# Patient Record
Sex: Female | Born: 1995 | Race: Black or African American | Hispanic: No | Marital: Single | Smoking: Never smoker
Health system: Southern US, Community
[De-identification: ages and names within clinical notes are randomized; demographics above are authoritative.]

## PROBLEM LIST (undated history)

## (undated) DIAGNOSIS — J45909 Unspecified asthma, uncomplicated: Secondary | ICD-10-CM

---
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
## 2013-11-03 ENCOUNTER — Emergency Department (HOSPITAL_COMMUNITY)
Admission: EM | Admit: 2013-11-03 | Discharge: 2013-11-04 | Disposition: A | Discharge status: Home / Self Care | Payer: BC Managed Care – PPO | Attending: Emergency Medicine | Admitting: Emergency Medicine

## 2013-11-03 ENCOUNTER — Encounter (HOSPITAL_COMMUNITY): Payer: Self-pay | Admitting: Emergency Medicine

## 2013-11-03 DIAGNOSIS — H5789 Other specified disorders of eye and adnexa: Secondary | ICD-10-CM | POA: Insufficient documentation

## 2013-11-03 DIAGNOSIS — J9801 Acute bronchospasm: Secondary | ICD-10-CM

## 2013-11-03 DIAGNOSIS — J45901 Unspecified asthma with (acute) exacerbation: Secondary | ICD-10-CM | POA: Insufficient documentation

## 2013-11-03 HISTORY — DX: Unspecified asthma, uncomplicated: J45.909

## 2013-11-03 MED ORDER — ALBUTEROL SULFATE (2.5 MG/3ML) 0.083% IN NEBU
5.0000 mg | INHALATION_SOLUTION | Freq: Once | RESPIRATORY_TRACT | Status: AC
  Administered 2013-11-03: 5 mg via RESPIRATORY_TRACT
  Filled 2013-11-03: qty 6

## 2013-11-03 NOTE — ED Notes (Signed)
Chest tight and wheezing. Doesn't use an inhaler.

## 2013-11-04 MED ORDER — ALBUTEROL SULFATE HFA 108 (90 BASE) MCG/ACT IN AERS
2.0000 | INHALATION_SPRAY | RESPIRATORY_TRACT | Status: DC
Start: 2013-11-04 — End: 2013-11-04
  Administered 2013-11-04: 2 via RESPIRATORY_TRACT
  Filled 2013-11-04: qty 6.7

## 2013-11-04 NOTE — ED Provider Notes (Signed)
CSN: [id number]     Arrival date & time 11/03/13  2326 History   First MD Initiated Contact with Patient 11/03/13 2359     Chief Complaint  Patient presents with  . Wheezing     (Consider location/radiation/quality/duration/timing/severity/associated sxs/prior Treatment) HPI Laura Cobb is a 18 y.o. female who presents emergency department complaining of shortness of breath and chest tightness. Patient states her symptoms began this afternoon. States she has history of asthma. She denies any particular triggers, states she thinks it could be seasonal allergies. States she began having coughing attacks, shortness of breath, and states felt like her chest was tight she wasn't getting enough air. Patient states she did not have an inhaler so she came to emergency department. Patient received a breathing treatment prior to me seeing her and states she feels much better. She denies any fever, chills. No sore throat. States mild nasal congestion watery itchy eyes. Patient does admit to smoking.  Past Medical History  Diagnosis Date  . Asthma    History reviewed. No pertinent past surgical history. No family history on file. History  Substance Use Topics  . Smoking status: Never Smoker   . Smokeless tobacco: Not on file  . Alcohol Use: No   OB History   Grav Para Term Preterm Abortions TAB SAB Ect Mult Living                 Review of Systems  Constitutional: Negative for fever and chills.  Respiratory: Positive for cough, chest tightness, shortness of breath and wheezing.   Cardiovascular: Negative for chest pain, palpitations and leg swelling.  Gastrointestinal: Negative for nausea, vomiting, abdominal pain and diarrhea.  Genitourinary: Negative for dysuria, flank pain and pelvic pain.  Musculoskeletal: Negative for arthralgias, myalgias, neck pain and neck stiffness.  Skin: Negative for rash.  Neurological: Negative for dizziness, weakness and headaches.  All other systems  reviewed and are negative.     Allergies  Peanut-containing drug products and Strawberry  Home Medications   Prior to Admission medications   Not on File   BP 120/72  Pulse 101  Temp(Src) 98.6 F (37 C) (Oral)  Resp 18  SpO2 98%  LMP 10/16/2013 Physical Exam  Nursing note and vitals reviewed. Constitutional: She appears well-developed and well-nourished. No distress.  HENT:  Head: Normocephalic.  Eyes: Conjunctivae are normal.  Neck: Neck supple.  Cardiovascular: Normal rate, regular rhythm and normal heart sounds.   Pulmonary/Chest: Effort normal and breath sounds normal. No respiratory distress. She has no wheezes. She has no rales.  Abdominal: Soft. Bowel sounds are normal. She exhibits no distension. There is no tenderness. There is no rebound.  Musculoskeletal: She exhibits no edema.  Neurological: She is alert.  Skin: Skin is warm and dry.  Psychiatric: She has a normal mood and affect. Her behavior is normal.    ED Course  Procedures (including critical care time) Labs Review Labs Reviewed - No data to display  Imaging Review No results found.   EKG Interpretation None      MDM   Final diagnoses:  Bronchospasm    Patient with seasonal allergies, here with acute asthma attack. She already received a breathing treatment prior to me seeing her, on my examination there is no wheezing, her exam is completely unremarkable. States she feels much better. She denies any longer having shortness of breath or chest tightness. Patient wants to go home. I provided her with an inhaler. Also advised her to start on Zyrtec  or Claritin for seasonal allergies. Upon discharge, patient is asymptomatic. She will followup with primary care Dr.   Ceasar MonsFiled Vitals:   11/03/13 2331  BP: 120/72  Pulse: 101  Temp: 98.6 F (37 C)  TempSrc: Oral  Resp: 18  SpO2: 98%       Myriam Jacobsonatyana A Sheniece Ruggles, PA-C 11/04/13 0037

## 2013-11-04 NOTE — ED Provider Notes (Signed)
Medical screening examination/treatment/procedure(s) were performed by non-physician practitioner and as supervising physician I was immediately available for consultation/collaboration.   EKG Interpretation None       Olivia Mackielga M Robbi Spells, MD 11/04/13 [phone]

## 2013-11-04 NOTE — Discharge Instructions (Signed)
Take any other, 2 puffs every 4 hours. Start taking Zyrtec or Claritin daily for seasonal allergies to prevent from triggering her asthma. Followup with your Dr. as needed. Return if your symptoms are worsening. Stop smoking   Asthma, Acute Bronchospasm Acute bronchospasm caused by asthma is also referred to as an asthma attack. Bronchospasm means your air passages become narrowed. The narrowing is caused by inflammation and tightening of the muscles in the air tubes (bronchi) in your lungs. This can make it hard to breath or cause you to wheeze and cough. CAUSES Possible triggers are:  Animal dander from the skin, hair, or feathers of animals.  Dust mites contained in house dust.  Cockroaches.  Pollen from trees or grass.  Mold.  Cigarette or tobacco smoke.  Air pollutants such as dust, household cleaners, hair sprays, aerosol sprays, paint fumes, strong chemicals, or strong odors.  Cold air or weather changes. Cold air may trigger inflammation. Winds increase molds and pollens in the air.  Strong emotions such as crying or laughing hard.  Stress.  Certain medicines such as aspirin or beta-blockers.  Sulfites in foods and drinks, such as dried fruits and wine.  Infections or inflammatory conditions, such as a flu, cold, or inflammation of the nasal membranes (rhinitis).  Gastroesophageal reflux disease (GERD). GERD is a condition where stomach acid backs up into your throat (esophagus).  Exercise or strenuous activity. SIGNS AND SYMPTOMS   Wheezing.  Excessive coughing, particularly at night.  Chest tightness.  Shortness of breath. DIAGNOSIS  Your health care provider will ask you about your medical history and perform a physical exam. A chest X-ray or blood testing may be performed to look for other causes of your symptoms or other conditions that may have triggered your asthma attack. TREATMENT  Treatment is aimed at reducing inflammation and opening up the  airways in your lungs. Most asthma attacks are treated with inhaled medicines. These include quick relief or rescue medicines (such as bronchodilators) and controller medicines (such as inhaled corticosteroids). These medicines are sometimes given through an inhaler or a nebulizer. Systemic steroid medicine taken by mouth or given through an IV tube also can be used to reduce the inflammation when an attack is moderate or severe. Antibiotic medicines are only used if a bacterial infection is present.  HOME CARE INSTRUCTIONS   Rest.  Drink plenty of liquids. This helps the mucus to remain thin and be easily coughed up. Only use caffeine in moderation and do not use alcohol until you have recovered from your illness.  Do not smoke. Avoid being exposed to secondhand smoke.  You play a critical role in keeping yourself in good health. Avoid exposure to things that cause you to wheeze or to have breathing problems.  Keep your medicines up to date and available. Carefully follow your health care provider's treatment plan.  Take your medicine exactly as prescribed.  When pollen or pollution is bad, keep windows closed and use an air conditioner or go to places with air conditioning.  Asthma requires careful medical care. See your health care provider for a follow-up as advised. If you are more than [redacted] weeks pregnant and you were prescribed any new medicines, let your obstetrician know about the visit and how you are doing. Follow-up with your health care provider as directed.  After you have recovered from your asthma attack, make an appointment with your outpatient doctor to talk about ways to reduce the likelihood of future attacks. If you do  not have a doctor who manages your asthma, make an appointment with a primary care doctor to discuss your asthma. SEEK IMMEDIATE MEDICAL CARE IF:   You are getting worse.  You have trouble breathing. If severe, call your local emergency services (911 in the  [place]  You develop chest pain or discomfort.  You are vomiting.  You are not able to keep fluids down.  You are coughing up yellow, green, brown, or bloody sputum.  You have a fever and your symptoms suddenly get worse.  You have trouble swallowing. MAKE SURE YOU:   Understand these instructions.  Will watch your condition.  Will get help right away if you are not doing well or get worse. Document Released: 10/22/2006 Document Revised: 03/09/2013 Document Reviewed: 01/12/2013 [facility] Patient Information 2014 [place], [place].  Asthma Attack Prevention Although there is no way to prevent asthma from starting, you can take steps to control the disease and reduce its symptoms. Learn about your asthma and how to control it. Take an active role to control your asthma by working with your health care provider to create and follow an asthma action plan. An asthma action plan guides you in:  Taking your medicines properly.  Avoiding things that set off your asthma or make your asthma worse (asthma triggers).  Tracking your level of asthma control.  Responding to worsening asthma.  Seeking emergency care when needed. To track your asthma, keep records of your symptoms, check your peak flow number using a handheld device that shows how well air moves out of your lungs (peak flow meter), and get regular asthma checkups.  WHAT ARE SOME WAYS TO PREVENT AN ASTHMA ATTACK?  Take medicines as directed by your health care provider.  Keep track of your asthma symptoms and level of control.  With your health care provider, write a detailed plan for taking medicines and managing an asthma attack. Then be sure to follow your action plan. Asthma is an ongoing condition that needs regular monitoring and treatment.  Identify and avoid asthma triggers. Many outdoor allergens and irritants (such as pollen, mold, cold air, and air pollution) can trigger asthma attacks. Find out what your asthma  triggers are and take steps to avoid them.  Monitor your breathing. Learn to recognize warning signs of an attack, such as coughing, wheezing, or shortness of breath. Your lung function may decrease before you notice any signs or symptoms, so regularly measure and record your peak airflow with a home peak flow meter.  Identify and treat attacks early. If you act quickly, you are less likely to have a severe attack. You will also need less medicine to control your symptoms. When your peak flow measurements decrease and alert you to an upcoming attack, take your medicine as instructed and immediately stop any activity that may have triggered the attack. If your symptoms do not improve, get medical help.  Pay attention to increasing quick-relief inhaler use. If you find yourself relying on your quick-relief inhaler, your asthma is not under control. See your health care provider about adjusting your treatment. WHAT CAN MAKE MY SYMPTOMS WORSE? A number of common things can set off or make your asthma symptoms worse and cause temporary increased inflammation of your airways. Keep track of your asthma symptoms for several weeks, detailing all the environmental and emotional factors that are linked with your asthma. When you have an asthma attack, go back to your asthma diary to see which factor, or combination of factors, might have  contributed to it. Once you know what these factors are, you can take steps to control many of them. If you have allergies and asthma, it is important to take asthma prevention steps at home. Minimizing contact with the substance to which you are allergic will help prevent an asthma attack. Some triggers and ways to avoid these triggers are: Animal Dander:  Some people are allergic to the flakes of skin or dried saliva from animals with fur or feathers.   There is no such thing as a hypoallergenic dog or cat breed. All dogs or cats can cause allergies, even if they don't  shed.  Keep these pets out of your home.  If you are not able to keep a pet outdoors, keep the pet out of your bedroom and other sleeping areas at all times, and keep the door closed.  Remove carpets and furniture covered with cloth from your home. If that is not possible, keep the pet away from fabric-covered furniture and carpets. Dust Mites: Many people with asthma are allergic to dust mites. Dust mites are tiny bugs that are found in every home in mattresses, pillows, carpets, fabric-covered furniture, bedcovers, clothes, stuffed toys, and other fabric-covered items.   Cover your mattress in a special dust-proof cover.  Cover your pillow in a special dust-proof cover, or wash the pillow each week in hot water. Water must be hotter than 130 F (54.4 C) to kill dust mites. Cold or warm water used with detergent and bleach can also be effective.  Wash the sheets and blankets on your bed each week in hot water.  Try not to sleep or lie on cloth-covered cushions.  Call ahead when traveling and ask for a smoke-free hotel room. Bring your own bedding and pillows in case the hotel only supplies feather pillows and down comforters, which may contain dust mites and cause asthma symptoms.  Remove carpets from your bedroom and those laid on concrete, if you can.  Keep stuffed toys out of the bed, or wash the toys weekly in hot water or cooler water with detergent and bleach. Cockroaches: Many people with asthma are allergic to the droppings and remains of cockroaches.   Keep food and garbage in closed containers. Never leave food out.  Use poison baits, traps, powders, gels, or paste (for example, boric acid).  If a spray is used to kill cockroaches, stay out of the room until the odor goes away. Indoor Mold:  Fix leaky faucets, pipes, or other sources of water that have mold around them.  Clean floors and moldy surfaces with a fungicide or diluted bleach.  Avoid using humidifiers,  vaporizers, or swamp coolers. These can spread molds through the air. Pollen and Outdoor Mold:  When pollen or mold spore counts are high, try to keep your windows closed.  Stay indoors with windows closed from late morning to afternoon. Pollen and some mold spore counts are highest at that time.  Ask your health care provider whether you need to take anti-inflammatory medicine or increase your dose of the medicine before your allergy season starts. Other Irritants to Avoid:  Tobacco smoke is an irritant. If you smoke, ask your health care provider how you can quit. Ask family members to quit smoking too. Do not allow smoking in your home or car.  If possible, do not use a wood-burning stove, kerosene heater, or fireplace. Minimize exposure to all sources of smoke, including to incense, candles, fires, and fireworks.  Try to stay away  from strong odors and sprays, such as perfume, talcum powder, hair spray, and paints.  Decrease humidity in your home and use an indoor air cleaning device. Reduce indoor humidity to below 60%. Dehumidifiers or central air conditioners can do this.  Decrease house dust exposure by changing furnace and air cooler filters frequently.  Try to have someone else vacuum for you once or twice a week. Stay out of rooms while they are being vacuumed and for a short while afterward.  If you vacuum, use a dust mask from a hardware store, a double-layered or microfilter vacuum cleaner bag, or a vacuum cleaner with a HEPA filter.  Sulfites in foods and beverages can be irritants. Do not drink beer or wine or eat dried fruit, processed potatoes, or shrimp if they cause asthma symptoms.  Cold air can trigger an asthma attack. Cover your nose and mouth with a scarf on cold or windy days.  Several health conditions can make asthma more difficult to manage, including a runny nose, sinus infections, reflux disease, psychological stress, and sleep apnea. Work with your health  care provider to manage these conditions.  Avoid close contact with people who have a respiratory infection such as a cold or the flu, since your asthma symptoms may get worse if you catch the infection. Wash your hands thoroughly after touching items that may have been handled by people with a respiratory infection.  Get a flu shot every year to protect against the flu virus, which often makes asthma worse for days or weeks. Also get a pneumonia shot if you have not previously had one. Unlike the flu shot, the pneumonia shot does not need to be given yearly. Medicines:  Talk to your health care provider about whether it is safe for you to take aspirin or non-steroidal anti-inflammatory medicines (NSAIDs). In a small number of people with asthma, aspirin and NSAIDs can cause asthma attacks. These medicines must be avoided by people who have known aspirin-sensitive asthma. It is important that people with aspirin-sensitive asthma read labels of all over-the-counter medicines used to treat pain, colds, coughs, and fever.  Beta blockers and ACE inhibitors are other medicines you should discuss with your health care provider. HOW CAN I FIND OUT WHAT I AM ALLERGIC TO? Ask your asthma health care provider about allergy skin testing or blood testing (the RAST test) to identify the allergens to which you are sensitive. If you are found to have allergies, the most important thing to do is to try to avoid exposure to any allergens that you are sensitive to as much as possible. Other treatments for allergies, such as medicines and allergy shots (immunotherapy) are available.  CAN I EXERCISE? Follow your health care provider's advice regarding asthma treatment before exercising. It is important to maintain a regular exercise program, but vigorous exercise, or exercise in cold, humid, or dry environments can cause asthma attacks, especially for those people who have exercise-induced asthma. Document Released:  06/25/2009 Document Revised: 03/09/2013 Document Reviewed: 01/12/2013 [facility] Patient Information 2014 [place], [place].

## 2014-08-23 ENCOUNTER — Emergency Department (HOSPITAL_COMMUNITY): Payer: No Typology Code available for payment source

## 2014-08-23 ENCOUNTER — Encounter (HOSPITAL_COMMUNITY): Payer: Self-pay | Admitting: Physical Medicine and Rehabilitation

## 2014-08-23 ENCOUNTER — Emergency Department (HOSPITAL_COMMUNITY)
Admission: EM | Admit: 2014-08-23 | Discharge: 2014-08-23 | Disposition: A | Discharge status: Home / Self Care | Payer: No Typology Code available for payment source | Attending: Emergency Medicine | Admitting: Emergency Medicine

## 2014-08-23 DIAGNOSIS — S0990XA Unspecified injury of head, initial encounter: Secondary | ICD-10-CM | POA: Diagnosis present

## 2014-08-23 DIAGNOSIS — Y998 Other external cause status: Secondary | ICD-10-CM | POA: Insufficient documentation

## 2014-08-23 DIAGNOSIS — S0083XA Contusion of other part of head, initial encounter: Secondary | ICD-10-CM | POA: Insufficient documentation

## 2014-08-23 DIAGNOSIS — Y92410 Unspecified street and highway as the place of occurrence of the external cause: Secondary | ICD-10-CM | POA: Diagnosis not present

## 2014-08-23 DIAGNOSIS — Y9389 Activity, other specified: Secondary | ICD-10-CM | POA: Insufficient documentation

## 2014-08-23 DIAGNOSIS — J45909 Unspecified asthma, uncomplicated: Secondary | ICD-10-CM | POA: Diagnosis not present

## 2014-08-23 MED ORDER — IBUPROFEN 400 MG PO TABS
600.0000 mg | ORAL_TABLET | Freq: Once | ORAL | Status: AC
  Administered 2014-08-23: 600 mg via ORAL
  Filled 2014-08-23 (×2): qty 1

## 2014-08-23 MED ORDER — CYCLOBENZAPRINE HCL 10 MG PO TABS: 10.0000 mg | ORAL_TABLET | Freq: Two times a day (BID) | ORAL | Status: DC | PRN

## 2014-08-23 MED ORDER — NAPROXEN 500 MG PO TABS: 500.0000 mg | ORAL_TABLET | Freq: Two times a day (BID) | ORAL | Status: DC
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
## 2014-08-23 NOTE — Discharge Instructions (Signed)
Take naproxen for pain and inflammation. Take Flexeril for muscle spasms. Ice the right side of your head. Follow up with primary care doctor.   Head Injury You have received a head injury. It does not appear serious at this time. Headaches and vomiting are common following head injury. It should be easy to awaken from sleeping. Sometimes it is necessary for you to stay in the emergency department for a while for observation. Sometimes admission to the hospital may be needed. After injuries such as yours, most problems occur within the first 24 hours, but side effects may occur up to 7-10 days after the injury. It is important for you to carefully monitor your condition and contact your health care provider or seek immediate medical care if there is a change in your condition. WHAT ARE THE TYPES OF HEAD INJURIES? Head injuries can be as minor as a bump. Some head injuries can be more severe. More severe head injuries include:  A jarring injury to the brain (concussion).  A bruise of the brain (contusion). This mean there is bleeding in the brain that can cause swelling.  A cracked skull (skull fracture).  Bleeding in the brain that collects, clots, and forms a bump (hematoma). WHAT CAUSES A HEAD INJURY? A serious head injury is most likely to happen to someone who is in a car wreck and is not wearing a seat belt. Other causes of major head injuries include bicycle or motorcycle accidents, sports injuries, and falls. HOW ARE HEAD INJURIES DIAGNOSED? A complete history of the event leading to the injury and your current symptoms will be helpful in diagnosing head injuries. Many times, pictures of the brain, such as CT or MRI are needed to see the extent of the injury. Often, an overnight hospital stay is necessary for observation.  WHEN SHOULD I SEEK IMMEDIATE MEDICAL CARE?  You should get help right away if:  You have confusion or drowsiness.  You feel sick to your stomach (nauseous) or have  continued, forceful vomiting.  You have dizziness or unsteadiness that is getting worse.  You have severe, continued headaches not relieved by medicine. Only take over-the-counter or prescription medicines for pain, fever, or discomfort as directed by your health care provider.  You do not have normal function of the arms or legs or are unable to walk.  You notice changes in the black spots in the center of the colored part of your eye (pupil).  You have a clear or bloody fluid coming from your nose or ears.  You have a loss of vision. During the next 24 hours after the injury, you must stay with someone who can watch you for the warning signs. This person should contact local emergency services (911 in the [place] if you have seizures, you become unconscious, or you are unable to wake up. HOW CAN I PREVENT A HEAD INJURY IN THE FUTURE? The most important factor for preventing major head injuries is avoiding motor vehicle accidents. To minimize the potential for damage to your head, it is crucial to wear seat belts while riding in motor vehicles. Wearing helmets while bike riding and playing collision sports (like football) is also helpful. Also, avoiding dangerous activities around the house will further help reduce your risk of head injury.  WHEN CAN I RETURN TO NORMAL ACTIVITIES AND ATHLETICS? You should be reevaluated by your health care provider before returning to these activities. If you have any of the following symptoms, you should not return to activities  or contact sports until 1 week after the symptoms have stopped:  Persistent headache.  Dizziness or vertigo.  Poor attention and concentration.  Confusion.  Memory problems.  Nausea or vomiting.  Fatigue or tire easily.  Irritability.  Intolerant of bright lights or loud noises.  Anxiety or depression.  Disturbed sleep. MAKE SURE YOU:   Understand these instructions.  Will watch your condition.  Will get help  right away if you are not doing well or get worse. Document Released: 07/07/2005 Document Revised: 07/12/2013 Document Reviewed: 03/14/2013 [facility] Patient Information 2015 [place], [place]. This information is not intended to replace advice given to you by your health care provider. Make sure you discuss any questions you have with your health care provider.

## 2014-08-23 NOTE — ED Notes (Signed)
Declined W/C at D/C and was escorted to lobby by RN. 
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
## 2014-09-27 ENCOUNTER — Encounter (HOSPITAL_COMMUNITY): Payer: Self-pay | Admitting: *Deleted

## 2014-09-27 ENCOUNTER — Inpatient Hospital Stay (HOSPITAL_COMMUNITY): Payer: BLUE CROSS/BLUE SHIELD

## 2014-09-27 ENCOUNTER — Inpatient Hospital Stay (HOSPITAL_COMMUNITY)
Admission: AD | Admit: 2014-09-27 | Discharge: 2014-09-27 | Disposition: A | Discharge status: Home / Self Care | Payer: BLUE CROSS/BLUE SHIELD | Source: Ambulatory Visit | Attending: Family Medicine | Admitting: Family Medicine

## 2014-09-27 DIAGNOSIS — O200 Threatened abortion: Secondary | ICD-10-CM | POA: Diagnosis not present

## 2014-09-27 DIAGNOSIS — Z3A01 Less than 8 weeks gestation of pregnancy: Secondary | ICD-10-CM | POA: Insufficient documentation

## 2014-09-27 DIAGNOSIS — O209 Hemorrhage in early pregnancy, unspecified: Secondary | ICD-10-CM

## 2014-09-27 LAB — CBC
HEMATOCRIT: 36.6 % (ref 36.0–46.0)
HEMOGLOBIN: 12.1 g/dL (ref 12.0–15.0)
MCH: 27.2 pg (ref 26.0–34.0)
MCHC: 33.1 g/dL (ref 30.0–36.0)
MCV: 82.2 fL (ref 78.0–100.0)
Platelets: 253 10*3/uL (ref 150–400)
RBC: 4.45 MIL/uL (ref 3.87–5.11)
RDW: 14.3 % (ref 11.5–15.5)
WBC: 8.7 10*3/uL (ref 4.0–10.5)

## 2014-09-27 LAB — URINALYSIS, ROUTINE W REFLEX MICROSCOPIC
Bilirubin Urine: NEGATIVE
Glucose, UA: NEGATIVE mg/dL
Ketones, ur: NEGATIVE mg/dL
Leukocytes, UA: NEGATIVE
NITRITE: NEGATIVE
PH: 6.5 (ref 5.0–8.0)
PROTEIN: NEGATIVE mg/dL
Specific Gravity, Urine: <1.005 — ABNORMAL LOW (ref 1.005–1.030)
Urobilinogen, UA: 0.2 mg/dL (ref 0.0–1.0)

## 2014-09-27 LAB — WET PREP, GENITAL
Trich, Wet Prep: NONE SEEN
WBC, Wet Prep HPF POC: NONE SEEN
Yeast Wet Prep HPF POC: NONE SEEN

## 2014-09-27 LAB — URINE MICROSCOPIC-ADD ON

## 2014-09-27 LAB — HCG, QUANTITATIVE, PREGNANCY: HCG, BETA CHAIN, QUANT, S: 2268 m[IU]/mL — AB (ref <5)

## 2014-09-27 LAB — POCT PREGNANCY, URINE: PREG TEST UR: POSITIVE — AB

## 2014-09-27 NOTE — MAU Note (Signed)
Couple days ago, started spotting, now it is heavier like a period. Some cramping in lower abd. Denies pain with urination, vomiting or diarrhea.

## 2014-09-27 NOTE — MAU Note (Signed)
Urine in lab 

## 2014-09-27 NOTE — Discharge Instructions (Signed)

## 2014-09-27 NOTE — MAU Provider Note (Signed)
History     CSN: [id number]  Arrival date and time: 09/27/14 1610   First Provider Initiated Contact with Patient 09/27/14 1700      Chief Complaint  Patient presents with  . Vaginal Bleeding   HPI Laura Cobb 18 y.o. G1P0 @[redacted]w[redacted]d  presents to MAU with complaint of vaginal bleeding and cramping.  She notes the bleeding started as spotting yesterday and today has become more like a light period day.  It is dark red blood and associated with a crampy pain.  She denies nausea, vomiting, weakness, fever, dysuria.   OB History    Gravida Para Term Preterm AB TAB SAB Ectopic Multiple Living   1               Past Medical History  Diagnosis Date  . Asthma     History reviewed. No pertinent past surgical history.  History reviewed. No pertinent family history.  History  Substance Use Topics  . Smoking status: Never Smoker   . Smokeless tobacco: Not on file  . Alcohol Use: No    Allergies:  Allergies  Allergen Reactions  . Peanut-Containing Drug Products Other (See Comments)    Childhood allergy   . Strawberry Other (See Comments)    Childhood allergy    Prescriptions prior to admission  Medication Sig Dispense Refill Last Dose  . cyclobenzaprine (FLEXERIL) 10 MG tablet Take 1 tablet (10 mg total) by mouth 2 (two) times daily as needed for muscle spasms. (Patient not taking: Reported on 09/27/2014) 20 tablet 0   . naproxen (NAPROSYN) 500 MG tablet Take 1 tablet (500 mg total) by mouth 2 (two) times daily. (Patient not taking: Reported on 09/27/2014) 30 tablet 0     ROS Pertinent ROS in HPI  Physical Exam   Blood pressure 119/59, pulse 84, temperature 98 F (36.7 C), temperature source Oral, resp. rate 16, height 5' (1.524 m), weight 119 lb (53.978 kg), last menstrual period 08/16/2014.  Physical Exam  Constitutional: She is oriented to person, place, and time. She appears well-developed and well-nourished. No distress.  HENT:  Head: Normocephalic and atraumatic.   Eyes: EOM are normal.  Neck: Normal range of motion.  Cardiovascular: Normal rate.   Respiratory: Effort normal and breath sounds normal. No respiratory distress.  GI: Soft. Bowel sounds are normal. She exhibits no distension. There is no tenderness. There is no rebound and no guarding.  Genitourinary:  Pooling of dark red blood in vagina.  NO CMT.  No adnexal mass or tenderness  Musculoskeletal: Normal range of motion.  Neurological: She is alert and oriented to person, place, and time.  Skin: Skin is warm and dry.  Psychiatric: She has a normal mood and affect.   [place] Ob Comp Less 14 Wks  09/27/2014   CLINICAL DATA:  Vaginal bleeding.  Symptoms for 2 days.  EXAM: OBSTETRIC <14 WK US AND TRANSVAGINAL OB US  TECHNIQUE: Both transabdominal and transvaginal ultrasound examinations were performed for complete evaluation of the gestation as well as the maternal uterus, adnexal regions, and pelvic cul-de-sac. Transvaginal technique was performed to assess early pregnancy.  COMPARISON:  None.  FINDINGS: Intrauterine gestational sac: Present  Yolk sac:  Questionable  Embryo:  None  MSD: 6.4  mm   5 w   2  d            US EDC: 05/28/2015  Maternal uterus/adnexae: The gestational sac has an oblong shape and located in the lower uterine segment. There is a  trace amount of free fluid in the pelvis. There is a questionable yolk sac without an embryo. Hypoechoic structure in the left ovary measuring up to 2.9 cm is compatible with a cyst. Normal appearance of the right ovary. Evidence for a small subchorionic hemorrhage.  IMPRESSION: Presumed gestational sac is located in the lower uterine segment. Based on the history of heavy vaginal bleeding and location of the gestational sac, findings are suspicious but not yet definitive for failed pregnancy or abortion in progress. Recommend follow-up US in 10-14 days for definitive diagnosis. This recommendation follows SRU consensus guidelines: Diagnostic Criteria for  Nonviable Pregnancy Early in the First Trimester. Malva Limes Med 2013; 161:0960-45.   Electronically Signed   By: Richarda Overlie M.D.   On: 09/27/2014 17:56   US Ob Transvaginal  09/27/2014   CLINICAL DATA:  Vaginal bleeding.  Symptoms for 2 days.  EXAM: OBSTETRIC <14 WK [place] AND TRANSVAGINAL OB US  TECHNIQUE: Both transabdominal and transvaginal ultrasound examinations were performed for complete evaluation of the gestation as well as the maternal uterus, adnexal regions, and pelvic cul-de-sac. Transvaginal technique was performed to assess early pregnancy.  COMPARISON:  None.  FINDINGS: Intrauterine gestational sac: Present  Yolk sac:  Questionable  Embryo:  None  MSD: 6.4  mm   5 w   2  d            [place] EDC: 05/28/2015  Maternal uterus/adnexae: The gestational sac has an oblong shape and located in the lower uterine segment. There is a trace amount of free fluid in the pelvis. There is a questionable yolk sac without an embryo. Hypoechoic structure in the left ovary measuring up to 2.9 cm is compatible with a cyst. Normal appearance of the right ovary. Evidence for a small subchorionic hemorrhage.  IMPRESSION: Presumed gestational sac is located in the lower uterine segment. Based on the history of heavy vaginal bleeding and location of the gestational sac, findings are suspicious but not yet definitive for failed pregnancy or abortion in progress. Recommend follow-up US in 10-14 days for definitive diagnosis. This recommendation follows SRU consensus guidelines: Diagnostic Criteria for Nonviable Pregnancy Early in the First Trimester. Malva Limes Med 2013; 409:8119-14.   Electronically Signed   By: Richarda Overlie M.D.   On: 09/27/2014 17:56   Results for orders placed or performed during the hospital encounter of 09/27/14 (from the past 24 hour(s))  Urinalysis, Routine w reflex microscopic     Status: Abnormal   Collection Time: 09/27/14  4:36 PM  Result Value Ref Range   Color, Urine YELLOW YELLOW   APPearance CLEAR  CLEAR   Specific Gravity, Urine <1.005 (L) 1.005 - 1.030   pH 6.5 5.0 - 8.0   Glucose, UA NEGATIVE NEGATIVE mg/dL   Hgb urine dipstick MODERATE (A) NEGATIVE   Bilirubin Urine NEGATIVE NEGATIVE   Ketones, ur NEGATIVE NEGATIVE mg/dL   Protein, ur NEGATIVE NEGATIVE mg/dL   Urobilinogen, UA 0.2 0.0 - 1.0 mg/dL   Nitrite NEGATIVE NEGATIVE   Leukocytes, UA NEGATIVE NEGATIVE  Urine microscopic-add on     Status: None   Collection Time: 09/27/14  4:36 PM  Result Value Ref Range   Squamous Epithelial / LPF RARE RARE   RBC / HPF 0-2 <3 RBC/hpf  Pregnancy, urine POC     Status: Abnormal   Collection Time: 09/27/14  4:39 PM  Result Value Ref Range   Preg Test, Ur POSITIVE (A) NEGATIVE  CBC  Status: None   Collection Time: 09/27/14  5:16 PM  Result Value Ref Range   WBC 8.7 4.0 - 10.5 K/uL   RBC 4.45 3.87 - 5.11 MIL/uL   Hemoglobin 12.1 12.0 - 15.0 g/dL   HCT 29.5 62.1 - 30.8 %   MCV 82.2 78.0 - 100.0 fL   MCH 27.2 26.0 - 34.0 pg   MCHC 33.1 30.0 - 36.0 g/dL   RDW 65.7 84.6 - 96.2 %   Platelets 253 150 - 400 K/uL  ABO/Rh     Status: None (Preliminary result)   Collection Time: 09/27/14  5:16 PM  Result Value Ref Range   ABO/RH(D) A POS   hCG, quantitative, pregnancy     Status: Abnormal   Collection Time: 09/27/14  5:16 PM  Result Value Ref Range   hCG, Beta Chain, Quant, S 2268 (H) <5 mIU/mL  Wet prep, genital     Status: Abnormal   Collection Time: 09/27/14  5:17 PM  Result Value Ref Range   Yeast Wet Prep HPF POC NONE SEEN NONE SEEN   Trich, Wet Prep NONE SEEN NONE SEEN   Clue Cells Wet Prep HPF POC FEW (A) NONE SEEN   WBC, Wet Prep HPF POC NONE SEEN NONE SEEN    MAU Course  Procedures  MDM Positive pregnancy however u/s suggests it may result in miscarriage.  No signs of infection requiring treatment at this time.  Rh + and therefore no rhogam required.  Will f/u with bloodwork in 48 hours  Assessment and Plan  A:  1. Threatened miscarriage in early pregnancy   2.  Vaginal bleeding in pregnancy, first trimester    P: Discharge to home Cont PNV Pelvic rest Return to MAU in 48 hours to follow up quant HCG.   Patient may return to MAU as needed or if her condition were to change or worsen   Bertram Denver 09/27/2014, 5:01 PM

## 2014-09-28 LAB — HIV ANTIBODY (ROUTINE TESTING W REFLEX): HIV SCREEN 4TH GENERATION: NONREACTIVE

## 2014-09-28 LAB — GC/CHLAMYDIA PROBE AMP (~~LOC~~) NOT AT ARMC
CHLAMYDIA, DNA PROBE: NEGATIVE
NEISSERIA GONORRHEA: NEGATIVE

## 2014-09-28 LAB — ABO/RH: ABO/RH(D): A POS

## 2014-10-02 ENCOUNTER — Inpatient Hospital Stay (HOSPITAL_COMMUNITY)
Admission: AD | Admit: 2014-10-02 | Discharge: 2014-10-02 | Disposition: A | Discharge status: Home / Self Care | Payer: BLUE CROSS/BLUE SHIELD | Source: Ambulatory Visit | Attending: Obstetrics and Gynecology | Admitting: Obstetrics and Gynecology

## 2014-10-02 DIAGNOSIS — Z3A01 Less than 8 weeks gestation of pregnancy: Secondary | ICD-10-CM | POA: Diagnosis not present

## 2014-10-02 DIAGNOSIS — O039 Complete or unspecified spontaneous abortion without complication: Secondary | ICD-10-CM | POA: Diagnosis not present

## 2014-10-02 DIAGNOSIS — O209 Hemorrhage in early pregnancy, unspecified: Secondary | ICD-10-CM | POA: Diagnosis present

## 2014-10-02 LAB — HCG, QUANTITATIVE, PREGNANCY: hCG, Beta Chain, Quant, S: 162 m[IU]/mL — ABNORMAL HIGH (ref <5)

## 2014-10-02 NOTE — MAU Provider Note (Signed)
Laura Cobb 18 y.o. G1P0 @ 1575w5d presents for follow up of HCG levels.  She was seen 09/27/14 for vaginal bleeding.  HCG levels >2000 at that time.  She has continued to have vaginal bleeding like a period.  No pain.   HCG today 168 A: SAB P: Discharge to home Return for HCG level in clinic in 1 week (on 3/21) between 1 and 3pm.   Patient may return to MAU as needed or if her condition were to change or worsen

## 2014-10-02 NOTE — MAU Note (Signed)
Pt here for F/U BHCG, was supossed to come on 3/11 but [place].  Pt denies pain today, is still bleeding like a period.

## 2014-10-09 ENCOUNTER — Ambulatory Visit: Payer: BLUE CROSS/BLUE SHIELD | Admitting: Obstetrics & Gynecology

## 2015-01-03 ENCOUNTER — Encounter (HOSPITAL_COMMUNITY): Payer: Self-pay | Admitting: Emergency Medicine

## 2015-01-03 ENCOUNTER — Emergency Department (HOSPITAL_COMMUNITY)
Admission: EM | Admit: 2015-01-03 | Discharge: 2015-01-03 | Disposition: A | Discharge status: Home / Self Care | Payer: BLUE CROSS/BLUE SHIELD | Attending: Emergency Medicine | Admitting: Emergency Medicine

## 2015-01-03 DIAGNOSIS — W01198A Fall on same level from slipping, tripping and stumbling with subsequent striking against other object, initial encounter: Secondary | ICD-10-CM | POA: Diagnosis not present

## 2015-01-03 DIAGNOSIS — Y9289 Other specified places as the place of occurrence of the external cause: Secondary | ICD-10-CM | POA: Diagnosis not present

## 2015-01-03 DIAGNOSIS — J45909 Unspecified asthma, uncomplicated: Secondary | ICD-10-CM | POA: Diagnosis not present

## 2015-01-03 DIAGNOSIS — Z23 Encounter for immunization: Secondary | ICD-10-CM | POA: Diagnosis not present

## 2015-01-03 DIAGNOSIS — Y998 Other external cause status: Secondary | ICD-10-CM | POA: Insufficient documentation

## 2015-01-03 DIAGNOSIS — S0181XA Laceration without foreign body of other part of head, initial encounter: Secondary | ICD-10-CM

## 2015-01-03 DIAGNOSIS — S01112A Laceration without foreign body of left eyelid and periocular area, initial encounter: Secondary | ICD-10-CM | POA: Insufficient documentation

## 2015-01-03 DIAGNOSIS — Y9341 Activity, dancing: Secondary | ICD-10-CM | POA: Insufficient documentation

## 2015-01-03 MED ORDER — TETANUS-DIPHTH-ACELL PERTUSSIS 5-2.5-18.5 LF-MCG/0.5 IM SUSP
0.5000 mL | Freq: Once | INTRAMUSCULAR | Status: AC
  Administered 2015-01-03: 0.5 mL via INTRAMUSCULAR
  Filled 2015-01-03: qty 0.5

## 2015-01-03 MED ORDER — LIDOCAINE HCL (PF) 1 % IJ SOLN
5.0000 mL | Freq: Once | INTRAMUSCULAR | Status: AC
  Administered 2015-01-03: 5 mL
  Filled 2015-01-03: qty 5
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
## 2015-01-03 NOTE — Discharge Instructions (Signed)
Facial Laceration A facial laceration is a cut on the face. These injuries can be painful and cause bleeding. Some cuts may need to be closed with stitches (sutures), skin adhesive strips, or wound glue. Cuts usually heal quickly but can leave a scar. It can take 1-2 years for the scar to go away completely. HOME CARE   Only take medicines as told by your doctor.  Follow your doctor's instructions for wound care. For Stitches:  Keep the cut clean and dry.  If you have a bandage (dressing), change it at least once a day. Change the bandage if it gets wet or dirty, or as told by your doctor.  Wash the cut with soap and water 2 times a day. Rinse the cut with water. Pat it dry with a clean towel.  Put a thin layer of medicated cream on the cut as told by your doctor.  You may shower after the first 24 hours. Do not soak the cut in water until the stitches are removed.  Have your stitches removed as told by your doctor.  Do not wear any makeup until a few days after your stitches are removed. For Skin Adhesive Strips:  Keep the cut clean and dry.  Do not get the strips wet. You may take a bath, but be careful to keep the cut dry.  If the cut gets wet, pat it dry with a clean towel.  The strips will fall off on their own. Do not remove the strips that are still stuck to the cut. For Wound Glue:  You may shower or take baths. Do not soak or scrub the cut. Do not swim. Avoid heavy sweating until the glue falls off on its own. After a shower or bath, pat the cut dry with a clean towel.  Do not put medicine or makeup on your cut until the glue falls off.  If you have a bandage, do not put tape over the glue.  Avoid lots of sunlight or tanning lamps until the glue falls off.  The glue will fall off on its own in 5-10 days. Do not pick at the glue. After Healing: Put sunscreen on the cut for the first year to reduce your scar. GET HELP RIGHT AWAY IF:   Your cut area gets red,  painful, or puffy (swollen).  You see a yellowish-white fluid (pus) coming from the cut.  You have chills or a fever. MAKE SURE YOU:   Understand these instructions.  Will watch your condition.  Will get help right away if you are not doing well or get worse. Document Released: 12/24/2007 Document Revised: 04/27/2013 Document Reviewed: 02/17/2013 [facility] Patient Information 2015 [place], [place]. This information is not intended to replace advice given to you by your health care provider. Make sure you discuss any questions you have with your health care provider. Sutured Wound Care Sutures are stitches that can be used to close wounds. Wound care helps prevent pain and infection.  HOME CARE INSTRUCTIONS   Rest and elevate the injured area until all the pain and swelling are gone.  Only take over-the-counter or prescription medicines for pain, discomfort, or fever as directed by your caregiver.  After 48 hours, gently wash the area with mild soap and water once a day, or as directed. Rinse off the soap. Pat the area dry with a clean towel. Do not rub the wound. This may cause bleeding.  Follow your caregiver's instructions for how often to change the bandage (dressing).  Stop using a dressing after 2 days or after the wound stops draining.  If the dressing sticks, moisten it with soapy water and gently remove it.  Apply ointment on the wound as directed.  Avoid stretching a sutured wound.  Drink enough fluids to keep your urine clear or pale yellow.  Follow up with your caregiver for suture removal as directed.  Use sunscreen on your wound for the next 3 to 6 months so the scar will not darken. SEEK IMMEDIATE MEDICAL CARE IF:   Your wound becomes red, swollen, hot, or tender.  You have increasing pain in the wound.  You have a red streak that extends from the wound.  There is pus coming from the wound.  You have a fever.  You have shaking chills.  There is a bad  smell coming from the wound.  You have persistent bleeding from the wound. MAKE SURE YOU:   Understand these instructions.  Will watch your condition.  Will get help right away if you are not doing well or get worse. Document Released: 08/14/2004 Document Revised: 09/29/2011 Document Reviewed: 11/10/2010 [facility] Patient Information 2015 [place], [place]. This information is not intended to replace advice given to you by your health care provider. Make sure you discuss any questions you have with your health care provider.

## 2015-01-03 NOTE — ED Provider Notes (Signed)
CSN: [id number]     Arrival date & time 01/03/15  0036 History   First MD Initiated Contact with Patient 01/03/15 0110     Chief Complaint  Patient presents with  . Head Laceration     (Consider location/radiation/quality/duration/timing/severity/associated sxs/prior Treatment) Patient is a 19 y.o. female presenting with scalp laceration. The history is provided by the patient. No language interpreter was used.  Head Laceration This is a new problem. The current episode started today. Pertinent negatives include no headaches, nausea, neck pain or vomiting. Associated symptoms comments: She fell while dancing and hit her head on the floor. No LOC, nausea. She sustained a laceration to the left eyebrow. No visual changes. .    Past Medical History  Diagnosis Date  . Asthma    History reviewed. No pertinent past surgical history. History reviewed. No pertinent family history. History  Substance Use Topics  . Smoking status: Never Smoker   . Smokeless tobacco: Not on file  . Alcohol Use: No   OB History    Gravida Para Term Preterm AB TAB SAB Ectopic Multiple Living   1              Review of Systems  Constitutional: Negative.   Eyes: Negative for visual disturbance.  Gastrointestinal: Negative for nausea and vomiting.  Musculoskeletal: Negative.  Negative for back pain and neck pain.  Skin: Positive for wound.       C/O laceration.  Neurological: Negative.  Negative for headaches.      Allergies  Peanut-containing drug products and Strawberry  Home Medications   Prior to Admission medications   Medication Sig Start Date End Date Taking? Authorizing Provider  cyclobenzaprine (FLEXERIL) 10 MG tablet Take 1 tablet (10 mg total) by mouth 2 (two) times daily as needed for muscle spasms. Patient not taking: Reported on 09/27/2014 08/23/14   Tatyana Kirichenko, PA-C   BP 116/86 mmHg  Pulse 92  Temp(Src) 98.3 F (36.8 C) (Oral)  Resp 20  SpO2 100%  LMP 12/08/2014   Breastfeeding? Unknown Physical Exam  Constitutional: She is oriented to person, place, and time. She appears well-developed and well-nourished.  HENT:  Head: Normocephalic.  No bony facial tenderness.   Eyes: Conjunctivae are normal. Pupils are equal, round, and reactive to light.  Neck: Normal range of motion.  Pulmonary/Chest: Effort normal.  Musculoskeletal:  No midline cervical tenderness.   Neurological: She is alert and oriented to person, place, and time.  Skin: Skin is warm and dry.  2 cm laceration left eyebrow line.     ED Course  Procedures (including critical care time) Labs Review Labs Reviewed - No data to display  Imaging Review No results found.   EKG Interpretation None     LACERATION REPAIR Performed by: Elpidio Anis A Authorized by: Elpidio Anis A Consent: Verbal consent obtained. Risks and benefits: risks, benefits and alternatives were discussed Consent given by: patient Patient identity confirmed: provided demographic data Prepped and Draped in normal sterile fashion Wound explored  Laceration Location: left eyebrow  Laceration Length: 2 cm  No Foreign Bodies seen or palpated  Anesthesia: local infiltration  Local anesthetic: lidocaine 2% w/o epinephrine  Anesthetic total: 2 ml  Irrigation method: syringe Amount of cleaning: standard  Skin closure: 6-0 prolene  Number of sutures: 5  Technique: simple interrupted  Patient tolerance: Patient tolerated the procedure well with no immediate complications.  MDM   Final diagnoses:  None    1. Facial laceration  Injuries isolated  to facial laceration following a mechanical fall. Stable for discharge.   Elpidio Anis, PA-C 01/03/15 0138  April Palumbo, MD 01/03/15 [id number]
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
## 2016-01-27 IMAGING — US US OB TRANSVAGINAL
1 series · 13 of 28 positions shown · non-contrast
Comparison: None.

CLINICAL DATA: Vaginal bleeding.  Symptoms for 2 days.

EXAM:
OBSTETRIC <14 WK US AND TRANSVAGINAL OB US
TECHNIQUE: Both transabdominal and transvaginal ultrasound examinations were
performed for complete evaluation of the gestation as well as the
maternal uterus, adnexal regions, and pelvic cul-de-sac.
Transvaginal technique was performed to assess early pregnancy.

[Series 1: us ob transvaginal · 0.15mm/px · 13 of 56 slices shown]
[im 3/56]
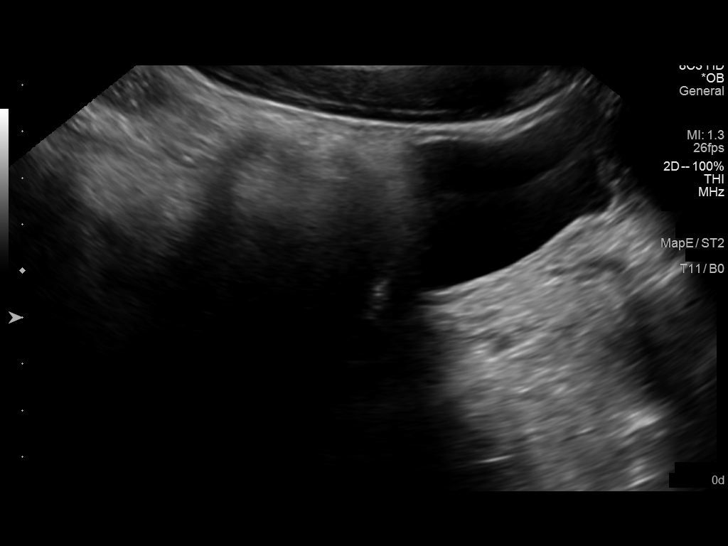
[im 7/56]
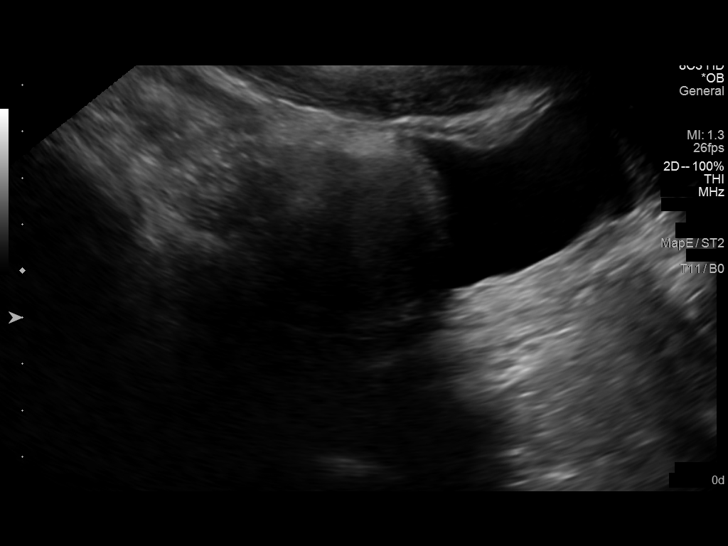
[im 11/56]
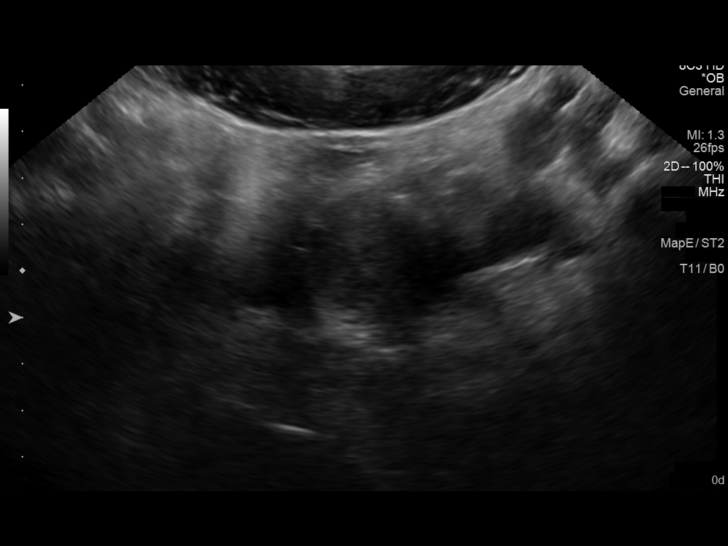
[im 15/56]
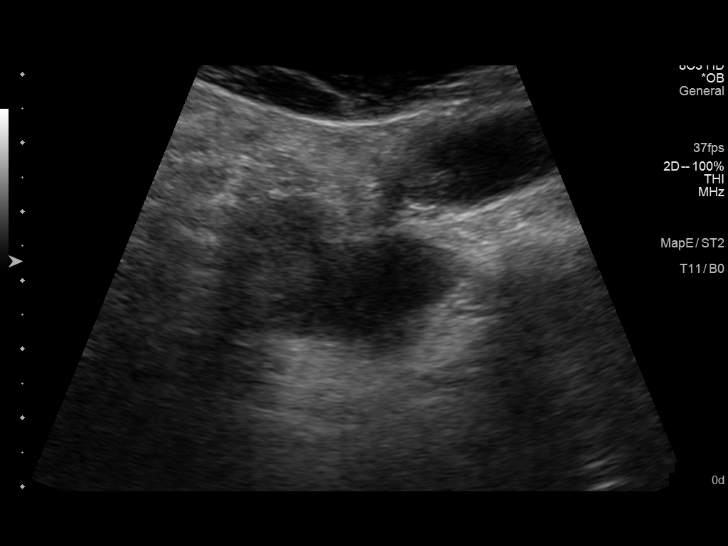
[im 19/56]
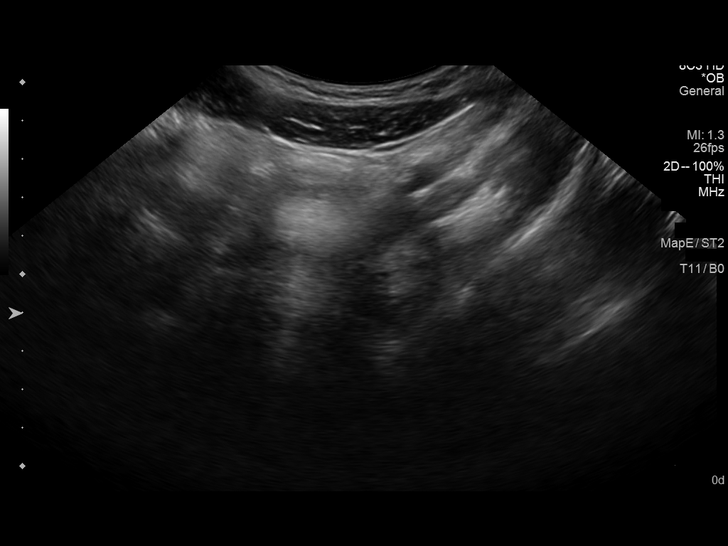
[im 23/56]
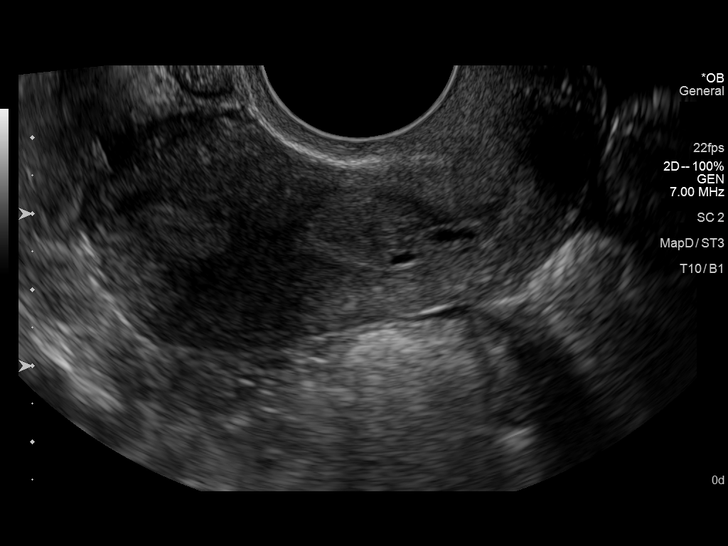
[im 29/56]
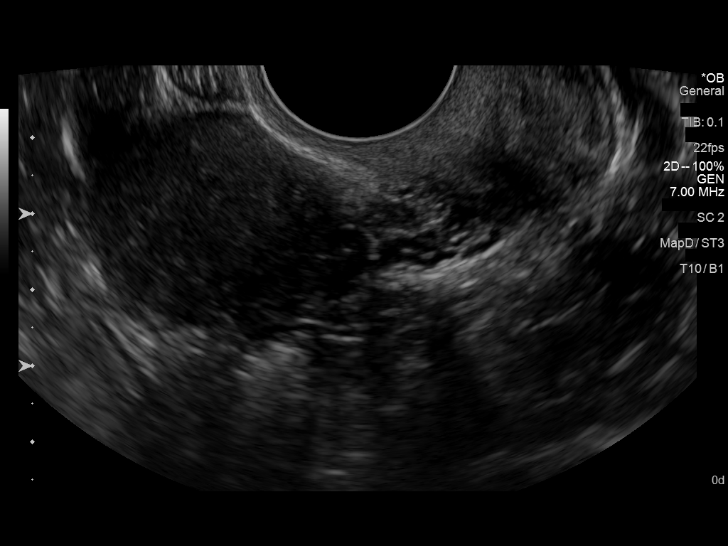
[im 33/56]
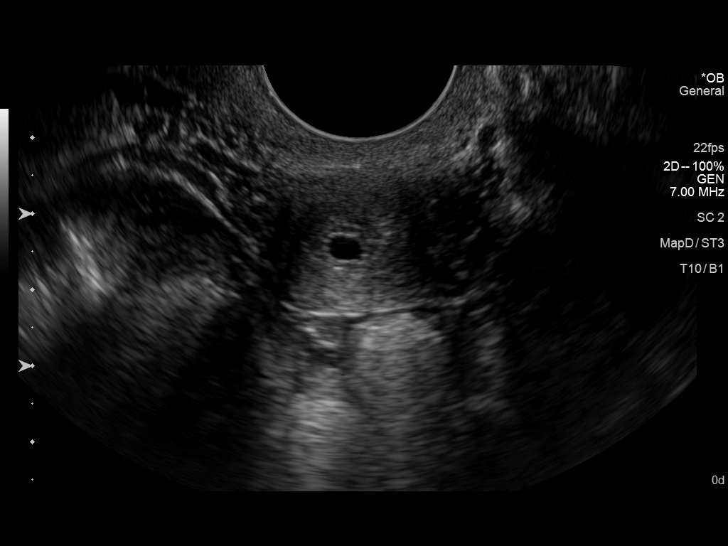
[im 37/56]
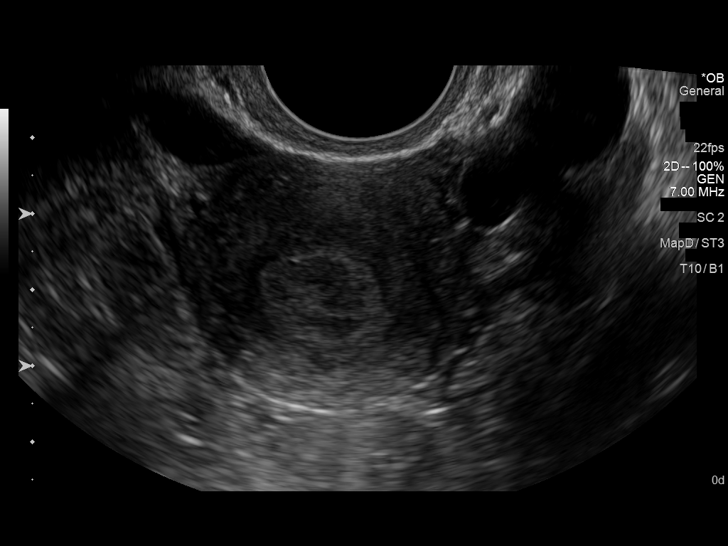
[im 41/56]
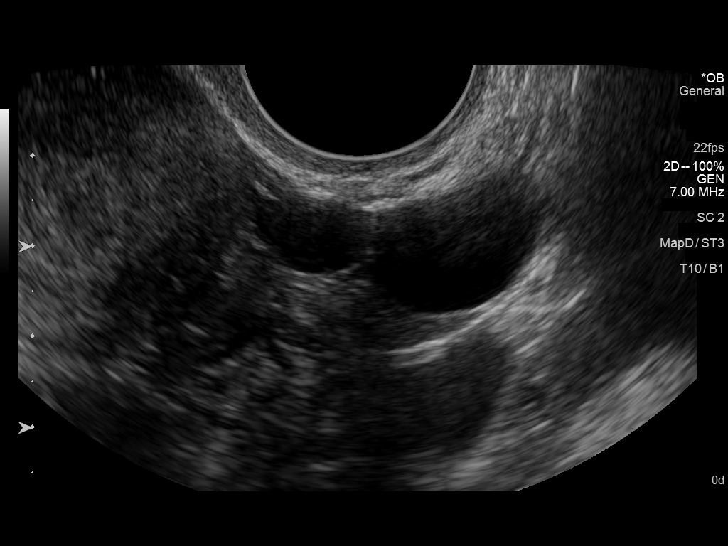
[im 45/56]
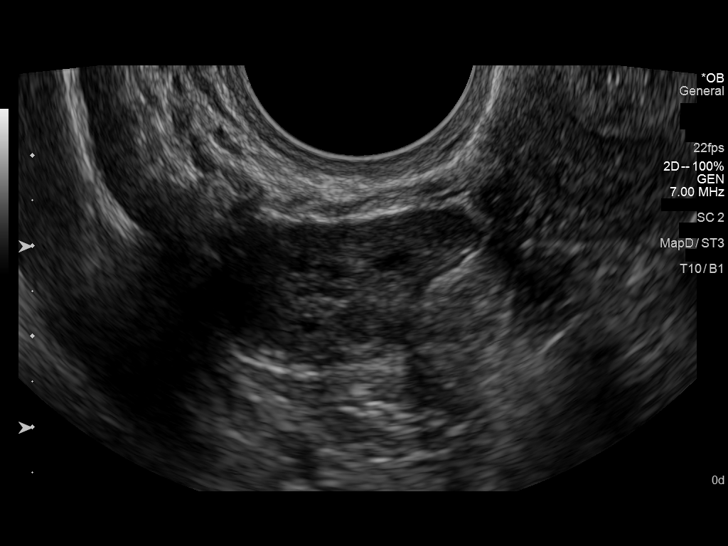
[im 49/56]
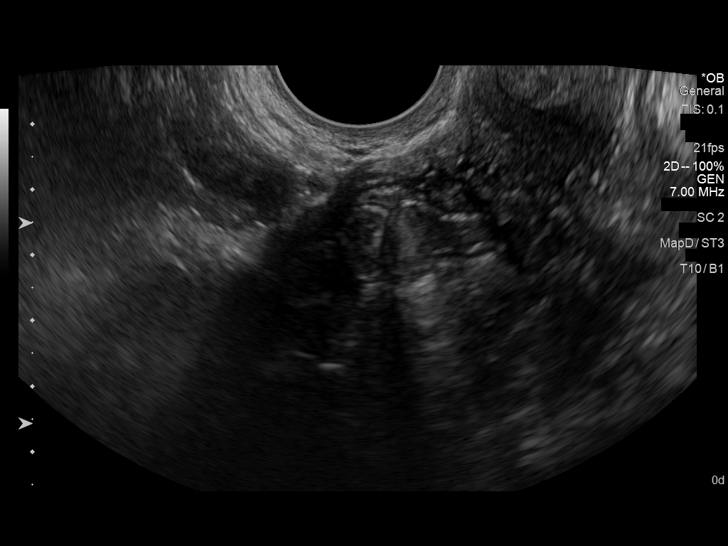
[im 53/56]
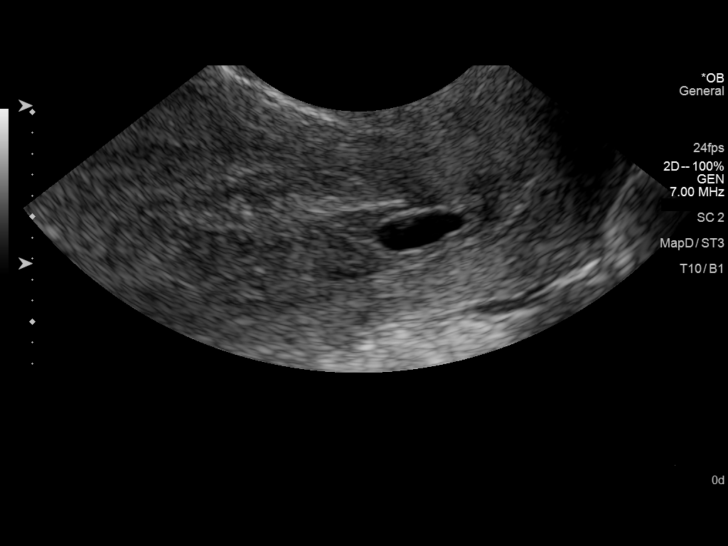

[13 of 28 positions shown; findings below may reference images not displayed]

FINDINGS: Intrauterine gestational sac: Present

Yolk sac:  Questionable

Embryo:  None

MSD: 6.4  mm   5 w   2  d            US EDC: 05/28/2015

Maternal uterus/adnexae: The gestational sac has an oblong shape and
located in the lower uterine segment. There is a trace amount of
free fluid in the pelvis. There is a questionable yolk sac without
an embryo. Hypoechoic structure in the left ovary measuring up to
2.9 cm is compatible with a cyst. Normal appearance of the right
ovary. Evidence for a small subchorionic hemorrhage.
IMPRESSION: Presumed gestational sac is located in the lower uterine segment.
Based on the history of heavy vaginal bleeding and location of the
gestational sac, findings are suspicious but not yet definitive for
failed pregnancy or abortion in progress. Recommend follow-up US in
10-14 days for definitive diagnosis. This recommendation follows SRU
consensus guidelines: Diagnostic Criteria for Nonviable Pregnancy
Early in the First Trimester. N Engl J Med 0752; [DATE].

## 2017-08-13 ENCOUNTER — Ambulatory Visit (HOSPITAL_COMMUNITY)
Admission: EM | Admit: 2017-08-13 | Discharge: 2017-08-13 | Disposition: A | Discharge status: Home / Self Care | Payer: BLUE CROSS/BLUE SHIELD | Attending: Family Medicine | Admitting: Family Medicine

## 2017-08-13 ENCOUNTER — Encounter (HOSPITAL_COMMUNITY): Payer: Self-pay | Admitting: Emergency Medicine

## 2017-08-13 DIAGNOSIS — R112 Nausea with vomiting, unspecified: Secondary | ICD-10-CM

## 2017-08-13 DIAGNOSIS — R197 Diarrhea, unspecified: Secondary | ICD-10-CM

## 2017-08-13 MED ORDER — ONDANSETRON HCL 4 MG PO TABS: 4.0000 mg | ORAL_TABLET | Freq: Three times a day (TID) | ORAL | 0 refills | Status: AC | PRN

## 2017-08-13 NOTE — Discharge Instructions (Signed)
Likely had a viral stomach bug. I have sent in zofran for you to take as needed for nausea. Stay hydrated.   If headache returns please try Ibuprofen and Tylenol.

## 2017-08-13 NOTE — ED Triage Notes (Addendum)
PT reports vomiting and diarrhea yesterday that has resolved. PT reports headache yesterday and today that she has not tried any OTC meds for.   PT reports no headache during triage.

## 2017-08-13 NOTE — ED Provider Notes (Signed)
[facility]    CSN: [id number] Arrival date & time: 08/13/17  1858     History   Chief Complaint Chief Complaint  Patient presents with  . Headache  . Emesis    HPI Laura Cobb is a 22 y.o. female no significant PMH presenting with nausea, vomtiing and diarrhea x 1 day. Symptoms happened yesterday, no longer persisting. Some nausea today with eating food, but has not vomited. Bowels have returned to normal. Also with a headache that resolved earlier today with eating. Denies history of HA/migraines. Has noticed them more frequently. Denies changes in vision.   HPI  Past Medical History:  Diagnosis Date  . Asthma     There are no active problems to display for this patient.   History reviewed. No pertinent surgical history.  OB History    Gravida Para Term Preterm AB Living   1             SAB TAB Ectopic Multiple Live Births                   Home Medications    Prior to Admission medications   Medication Sig Start Date End Date Taking? Authorizing Provider  cyclobenzaprine (FLEXERIL) 10 MG tablet Take 1 tablet (10 mg total) by mouth 2 (two) times daily as needed for muscle spasms. Patient not taking: Reported on 09/27/2014 08/23/14   Jaynie Crumble, PA-C  ondansetron (ZOFRAN) 4 MG tablet Take 1 tablet (4 mg total) by mouth every 8 (eight) hours as needed for up to 5 days for nausea or vomiting. 08/13/17 08/18/17  Javonte Elenes, Junius Creamer, PA-C    Family History No family history on file.  Social History Social History   Tobacco Use  . Smoking status: Never Smoker  . Smokeless tobacco: Never Used  Substance Use Topics  . Alcohol use: Yes    Comment: occ.  . Drug use: No     Allergies   Peanut-containing drug products and Strawberry extract   Review of Systems Review of Systems  Constitutional: Negative for fever.  Eyes: Negative for visual disturbance.  Respiratory: Negative for shortness of breath.   Cardiovascular: Negative for  chest pain.  Gastrointestinal: Positive for diarrhea, nausea and vomiting. Negative for abdominal pain.  Neurological: Positive for headaches. Negative for dizziness, weakness and light-headedness.     Physical Exam Triage Vital Signs ED Triage Vitals  Enc Vitals Group     BP 08/13/17 1909 110/74     Pulse Rate 08/13/17 1909 86     Resp 08/13/17 1909 16     Temp 08/13/17 1909 98.2 F (36.8 C)     Temp Source 08/13/17 1909 Oral     SpO2 08/13/17 1909 95 %     Weight 08/13/17 1910 130 lb (59 kg)     Height 08/13/17 1910 5\' 1"  (1.549 m)     Head Circumference --      Peak Flow --      Pain Score 08/13/17 1913 0     Pain Loc --      Pain Edu? --      Excl. in GC? --    No data found.  Updated Vital Signs BP 110/74 (BP Location: Right Arm)   Pulse 86   Temp 98.2 F (36.8 C) (Oral)   Resp 16   Ht 5\' 1"  (1.549 m)   Wt 130 lb (59 kg)   LMP 08/12/2017   SpO2 95%   BMI 24.56 kg/m  Physical Exam  Constitutional: She is oriented to person, place, and time. She appears well-developed and well-nourished. No distress.  NAD  HENT:  Head: Normocephalic and atraumatic.  Eyes: Conjunctivae and EOM are normal. Pupils are equal, round, and reactive to light.  Neck: Neck supple.  Cardiovascular: Normal rate and regular rhythm.  No murmur heard. Pulmonary/Chest: Effort normal and breath sounds normal. No respiratory distress.  CTABL  Abdominal: Soft. There is no tenderness.  nontender to light and deep palpation  Musculoskeletal: She exhibits no edema.  Neurological: She is alert and oriented to person, place, and time. No cranial nerve deficit.  Skin: Skin is warm and dry.  Psychiatric: She has a normal mood and affect.  Nursing note and vitals reviewed.    UC Treatments / Results  Labs (all labs ordered are listed, but only abnormal results are displayed) Labs Reviewed - No data to display  EKG  EKG Interpretation None       Radiology No results  found.  Procedures Procedures (including critical care time)  Medications Ordered in UC Medications - No data to display   Initial Impression / Assessment and Plan / UC Course  I have reviewed the triage vital signs and the nursing notes.  Pertinent labs & imaging results that were available during my care of the patient were reviewed by me and considered in my medical decision making (see chart for details).     Symptoms have resolved. Appears likely a GI virus. Provided zofran for further nausea. HA resolved. Advised ibuprofen/Tylenol for further headaches. Work noted provided. Discussed strict return precautions. Patient verbalized understanding and is agreeable with plan.   Final Clinical Impressions(s) / UC Diagnoses   Final diagnoses:  Nausea vomiting and diarrhea    ED Discharge Orders        Ordered    ondansetron (ZOFRAN) 4 MG tablet  Every 8 hours PRN     08/13/17 1945       Controlled Substance Prescriptions [place] Controlled Substance Registry consulted? Not Applicable   Lew DawesWieters, Taneeka Curtner C, [place]PA-C 08/13/17 1951

## 2017-11-17 ENCOUNTER — Ambulatory Visit (HOSPITAL_COMMUNITY)
Admission: EM | Admit: 2017-11-17 | Discharge: 2017-11-17 | Disposition: A | Discharge status: Home / Self Care | Payer: Self-pay | Attending: Family Medicine | Admitting: Family Medicine

## 2017-11-17 ENCOUNTER — Encounter (HOSPITAL_COMMUNITY): Payer: Self-pay | Admitting: Emergency Medicine

## 2017-11-17 ENCOUNTER — Telehealth (HOSPITAL_COMMUNITY): Payer: Self-pay | Admitting: Emergency Medicine

## 2017-11-17 DIAGNOSIS — K644 Residual hemorrhoidal skin tags: Secondary | ICD-10-CM

## 2017-11-17 MED ORDER — POLYETHYLENE GLYCOL 3350 17 G PO PACK: 17.0000 g | PACK | Freq: Every day | ORAL | 0 refills | Status: DC

## 2017-11-17 MED ORDER — HYDROCORTISONE 2.5 % RE CREA: 1.0000 "application " | TOPICAL_CREAM | Freq: Two times a day (BID) | RECTAL | 0 refills | Status: AC

## 2017-11-17 MED ORDER — POLYETHYLENE GLYCOL 3350 17 G PO PACK: 17.0000 g | PACK | Freq: Every day | ORAL | 0 refills | Status: AC

## 2017-11-17 MED ORDER — HYDROCORTISONE 2.5 % RE CREA: 1.0000 "application " | TOPICAL_CREAM | Freq: Two times a day (BID) | RECTAL | 0 refills | Status: DC

## 2017-11-17 NOTE — Discharge Instructions (Addendum)
Start Anusol as directed. Sitz baths. Start MiraLAX as directed.  Keep hydrated, urine should be clear to pale yellow in color.  Monitor for worsening symptoms, severe pain with swelling to the hemorrhoid, unable to sit down, follow-up for reevaluation.

## 2017-11-17 NOTE — ED Triage Notes (Signed)
Pt c/o possible hemorrhoid, states "when I used the bathroom I notice blood. Sometimes I notice a little lump"

## 2017-11-17 NOTE — ED Provider Notes (Signed)
[facility]    CSN: [id number] Arrival date & time: 11/17/17  1538     History   Chief Complaint Chief Complaint  Patient presents with  . Hemorrhoids    HPI Laura Cobb is a 22 y.o. female.   22 year old female comes in for possible hemorrhoid.  States that about 3 days ago, started having blood with wiping after bowel movement, and can feel a small bump at the rectal area.  Patient with history of constipation, and has had to strain for bowel movements. Has not tried anything for the symptoms. Denies melena, hematochezia.     Past Medical History:  Diagnosis Date  . Asthma     There are no active problems to display for this patient.   History reviewed. No pertinent surgical history.  OB History    Gravida  1   Para      Term      Preterm      AB      Living        SAB      TAB      Ectopic      Multiple      Live Births               Home Medications    Prior to Admission medications   Medication Sig Start Date End Date Taking? Authorizing Provider  norethindrone-ethinyl estradiol (JUNEL FE,GILDESS FE,LOESTRIN FE) 1-20 MG-MCG tablet Take 1 tablet by mouth daily.   Yes [provider]  cyclobenzaprine (FLEXERIL) 10 MG tablet Take 1 tablet (10 mg total) by mouth 2 (two) times daily as needed for muscle spasms. Patient not taking: Reported on 09/27/2014 08/23/14   Jaynie Crumble, PA-C  hydrocortisone (ANUSOL-HC) 2.5 % rectal cream Place 1 application rectally 2 (two) times daily. 11/17/17   Cathie Hoops, Amy V, PA-C  polyethylene glycol (MIRALAX) packet Take 17 g by mouth daily. 11/17/17   Belinda Fisher, PA-C    Family History No family history on file.  Social History Social History   Tobacco Use  . Smoking status: Never Smoker  . Smokeless tobacco: Never Used  Substance Use Topics  . Alcohol use: Yes    Comment: occ.  . Drug use: No     Allergies   Peanut-containing drug products; Eggs or egg-derived products;  and Strawberry extract   Review of Systems Review of Systems  Reason unable to perform ROS: See HPI as above.     Physical Exam Triage Vital Signs ED Triage Vitals [11/17/17 1546]  Enc Vitals Group     BP 119/75     Pulse Rate 73     Resp 18     Temp 98.3 F (36.8 C)     Temp src      SpO2 99 %     Weight      Height      Head Circumference      Peak Flow      Pain Score      Pain Loc      Pain Edu?      Excl. in GC?    No data found.  Updated Vital Signs BP 119/75   Pulse 73   Temp 98.3 F (36.8 C)   Resp 18   LMP 11/17/2017   SpO2 99%   Physical Exam  Constitutional: She is oriented to person, place, and time. She appears well-developed and well-nourished. No distress.  HENT:  Head: Normocephalic and atraumatic.  Eyes: Pupils are equal, round, and reactive to light. Conjunctivae are normal.  Genitourinary:  Genitourinary Comments: Chaperone present.  Non-thrombosed external hemorrhoid noted at the 12:00 area.  No obvious tenderness to palpation.  Neurological: She is alert and oriented to person, place, and time.     UC Treatments / Results  Labs (all labs ordered are listed, but only abnormal results are displayed) Labs Reviewed - No data to display  EKG None  Radiology No results found.  Procedures Procedures (including critical care time)  Medications Ordered in UC Medications - No data to display  Initial Impression / Assessment and Plan / UC Course  I have reviewed the triage vital signs and the nursing notes.  Pertinent labs & imaging results that were available during my care of the patient were reviewed by me and considered in my medical decision making (see chart for details).    Anusol as directed.  Sitz bath's.  Patient to start MiraLAX to help with bowel movement.  Push fluids.  Return precautions given.  Patient expresses understanding and agrees to plan.  Final Clinical Impressions(s) / UC Diagnoses   Final diagnoses:    External hemorrhoid    ED Prescriptions    Medication Sig Dispense Auth. Provider   hydrocortisone (ANUSOL-HC) 2.5 % rectal cream Place 1 application rectally 2 (two) times daily. 30 g Yu, Amy V, PA-C   polyethylene glycol (MIRALAX) packet Take 17 g by mouth daily. [address] each Threasa Alpha, PA-C 11/17/17 1620

## 2018-02-24 ENCOUNTER — Encounter (HOSPITAL_COMMUNITY): Payer: Self-pay | Admitting: Emergency Medicine

## 2018-02-24 ENCOUNTER — Emergency Department (HOSPITAL_COMMUNITY)
Admission: EM | Admit: 2018-02-24 | Discharge: 2018-02-24 | Disposition: A | Discharge status: Home / Self Care | Payer: Self-pay | Attending: Emergency Medicine | Admitting: Emergency Medicine

## 2018-02-24 DIAGNOSIS — W208XXA Other cause of strike by thrown, projected or falling object, initial encounter: Secondary | ICD-10-CM | POA: Insufficient documentation

## 2018-02-24 DIAGNOSIS — J45909 Unspecified asthma, uncomplicated: Secondary | ICD-10-CM | POA: Insufficient documentation

## 2018-02-24 DIAGNOSIS — Y939 Activity, unspecified: Secondary | ICD-10-CM | POA: Insufficient documentation

## 2018-02-24 DIAGNOSIS — Y999 Unspecified external cause status: Secondary | ICD-10-CM | POA: Insufficient documentation

## 2018-02-24 DIAGNOSIS — S91114A Laceration without foreign body of right lesser toe(s) without damage to nail, initial encounter: Secondary | ICD-10-CM | POA: Insufficient documentation

## 2018-02-24 DIAGNOSIS — Y929 Unspecified place or not applicable: Secondary | ICD-10-CM | POA: Insufficient documentation

## 2018-02-24 MED ORDER — TETANUS-DIPHTH-ACELL PERTUSSIS 5-2.5-18.5 LF-MCG/0.5 IM SUSP
0.5000 mL | Freq: Once | INTRAMUSCULAR | Status: AC
  Administered 2018-02-24: 0.5 mL via INTRAMUSCULAR
  Filled 2018-02-24: qty 0.5

## 2018-02-24 NOTE — ED Triage Notes (Signed)
Pt reports laceration to R third toe, cut it on a can lid. Denies pain, but throbbing. Bleeding controlled.

## 2018-02-24 NOTE — Discharge Instructions (Signed)
You were seen in the emergency department today for a laceration. Your laceration was closed with steri strips and subsequently bandaged. Keep this bandage on for the next 2-3 days to avoid separating the wound. Keep the area dry until you remove the bandage.    Your tetanus has been updated at today's visit.   Follow up with your primary care provider for a recheck of the area in 1 week. Please return to the emergency department, go to an urgent care, or see your primary care provider to have this performed. Return to the ER soon should you start to experience pus type drainage from the wound, redness around the wound, or fevers as this could indicate the area is infected, please return to the ER for any other worsening symptoms or concerns that you may have.

## 2018-02-24 NOTE — ED Provider Notes (Signed)
[facility]   CSN: [id number] Arrival date & time: 02/24/18  2112     History   Chief Complaint Chief Complaint  Patient presents with  . Laceration    HPI Laura Cobb is a 22 y.o. female with a history of asthma who presents to the emergency department for wound to right third toe which occurred around 20:30 this evening.  Patient states that she dropped a portion of an empty can onto the toe resulting in a laceration.  She states she is able to control the bleeding.  She did not clean the area prior to arrival.  She states the area is not necessarily painful.  No specific alleviating or aggravating factors.  Denies numbness, tingling, or weakness.  Her last tetanus shot was in 2013.  HPI  Past Medical History:  Diagnosis Date  . Asthma     There are no active problems to display for this patient.   History reviewed. No pertinent surgical history.   OB History    Gravida  1   Para      Term      Preterm      AB      Living        SAB      TAB      Ectopic      Multiple      Live Births               Home Medications    Prior to Admission medications   Medication Sig Start Date End Date Taking? Authorizing Provider  hydrocortisone (ANUSOL-HC) 2.5 % rectal cream Place 1 application rectally 2 (two) times daily. 11/17/17   Cathie HoopsYu, Amy V, PA-C  norethindrone-ethinyl estradiol (JUNEL FE,GILDESS FE,LOESTRIN FE) 1-20 MG-MCG tablet Take 1 tablet by mouth daily.    [provider]  polyethylene glycol (MIRALAX) packet Take 17 g by mouth daily. 11/17/17   Belinda FisherYu, Amy V, PA-C    Family History History reviewed. No pertinent family history.  Social History Social History   Tobacco Use  . Smoking status: Never Smoker  . Smokeless tobacco: Never Used  Substance Use Topics  . Alcohol use: Yes    Comment: occ.  . Drug use: No     Allergies   Peanut-containing drug products; Eggs or  egg-derived products; and Strawberry extract   Review of Systems Review of Systems  Constitutional: Negative for chills and fever.  Skin: Positive for wound.  Neurological: Negative for weakness and numbness.     Physical Exam Updated Vital Signs BP 115/61 (BP Location: Right Arm)   Pulse 89   Temp 98.4 F (36.9 C) (Oral)   Resp 16   Ht 5\' 2"  (1.575 m)   Wt 61.2 kg (135 lb)   LMP  (LMP Unknown)   SpO2 96%   BMI 24.69 kg/m   Physical Exam  Constitutional: She appears well-developed and well-nourished. No distress.  HENT:  Head: Normocephalic and atraumatic.  Eyes: Conjunctivae are normal. Right eye exhibits no discharge. Left eye exhibits no discharge.  Musculoskeletal:  Patient has a very superficial 1 cm laceration to the dorsal surface of the right third toe overlying the DIP joint area.  no active bleeding.  No appreciable foreign body.  No appreciable swelling, erythema, or ecchymosis.  Patient has normal range of motion to all digits of the lower extremities.  Lower extremities are nontender to palpation including area of underlying laceration. NVI distally.  Neurological: She is alert.  Clear speech.  Sensation grossly intact bilateral lower extremities.  5 out of 5 strength plantar dorsiflexion bilaterally.  Gait intact.  Skin: Capillary refill takes less than 2 seconds.  Psychiatric: She has a normal mood and affect. Her behavior is normal. Thought content normal.  Nursing note and vitals reviewed.    ED Treatments / Results  Labs (all labs ordered are listed, but only abnormal results are displayed) Labs Reviewed - No data to display  EKG None  Radiology No results found.  Procedures .Marland KitchenLaceration Repair Date/Time: 02/24/2018 10:30 PM Performed by: Cherly Anderson, PA-C Authorized by: Cherly Anderson, PA-C   Consent:    Consent obtained:  Verbal   Consent given by:  Patient   Risks discussed:  Infection, need for additional repair, poor  wound healing and poor cosmetic result   Alternatives discussed:  No treatment Anesthesia (see MAR for exact dosages):    Anesthesia method:  None Laceration details:    Location:  Toe   Toe location:  R third toe   Length (cm):  1   Depth (mm):  2 Repair type:    Repair type:  Simple Exploration:    Hemostasis achieved with:  Direct pressure   Wound exploration: wound explored through full range of motion and entire depth of wound probed and visualized     Contaminated: no   Treatment:    Area cleansed with:  Betadine   Amount of cleaning:  Standard   Irrigation solution:  Sterile water   Irrigation method:  Pressure wash Skin repair:    Repair method:  Steri-Strips   Number of Steri-Strips:  2 Approximation:    Approximation:  Close Post-procedure details:    Dressing:  Adhesive bandage and splint for protection   Patient tolerance of procedure:  Tolerated well, no immediate complications   (including critical care time)  Medications Ordered in ED Medications  Tdap (BOOSTRIX) injection 0.5 mL (has no administration in time range)     Initial Impression / Assessment and Plan / ED Course  I have reviewed the triage vital signs and the nursing notes.  Pertinent labs & imaging results that were available during my care of the patient were reviewed by me and considered in my medical decision making (see chart for details).   Patient presents to the emergency department with superficial laceration to the dorsum of the right third toe. No underlying tenderness to raise concern for underlying fracture/dislocation.NVI distally. Pressure irrigation performed. Wound explored and base of wound visualized in a bloodless field without evidence of foreign body. Laceration is very superficial, does not appear to require suture type repair, closed with steri strips per procedure not above with bandage and buddy tape to aide in maintaining closure. Tetanus updated at today's visit. Do not  feel that abx are indicated at this time based on wound appearance and lack of significant comorbidities. Discussed home care as well as need for wound recheck by PCP.  I discussed results, treatment plan, need for follow-up, and return precautions with the patient including signs of infection. Provided opportunity for questions, patient confirmed understanding and is in agreement with plan.    Final Clinical Impressions(s) / ED Diagnoses   Final diagnoses:  Laceration of lesser toe of right foot without foreign body present or damage to nail, initial encounter    ED Discharge Orders    None       Cherly Anderson, PA-C 02/24/18 2246  Charlynne Pander, MD 02/25/18 [phone]

## 2018-02-24 NOTE — ED Notes (Signed)
Declined W/C at D/C and was escorted to lobby by RN. 

## 2018-07-28 ENCOUNTER — Encounter (HOSPITAL_COMMUNITY): Payer: Self-pay | Admitting: Emergency Medicine

## 2018-07-28 ENCOUNTER — Other Ambulatory Visit: Payer: Self-pay

## 2018-07-28 ENCOUNTER — Emergency Department (HOSPITAL_COMMUNITY)
Admission: EM | Admit: 2018-07-28 | Discharge: 2018-07-28 | Disposition: A | Discharge status: Home / Self Care | Payer: No Typology Code available for payment source | Attending: Emergency Medicine | Admitting: Emergency Medicine

## 2018-07-28 DIAGNOSIS — Y929 Unspecified place or not applicable: Secondary | ICD-10-CM | POA: Insufficient documentation

## 2018-07-28 DIAGNOSIS — S39012A Strain of muscle, fascia and tendon of lower back, initial encounter: Secondary | ICD-10-CM | POA: Diagnosis not present

## 2018-07-28 DIAGNOSIS — Y999 Unspecified external cause status: Secondary | ICD-10-CM | POA: Diagnosis not present

## 2018-07-28 DIAGNOSIS — Z91010 Allergy to peanuts: Secondary | ICD-10-CM | POA: Diagnosis not present

## 2018-07-28 DIAGNOSIS — Z79899 Other long term (current) drug therapy: Secondary | ICD-10-CM | POA: Diagnosis not present

## 2018-07-28 DIAGNOSIS — J45909 Unspecified asthma, uncomplicated: Secondary | ICD-10-CM | POA: Insufficient documentation

## 2018-07-28 DIAGNOSIS — Y939 Activity, unspecified: Secondary | ICD-10-CM | POA: Insufficient documentation

## 2018-07-28 DIAGNOSIS — S3992XA Unspecified injury of lower back, initial encounter: Secondary | ICD-10-CM | POA: Diagnosis present

## 2018-07-28 MED ORDER — METHOCARBAMOL 500 MG PO TABS: 500.0000 mg | ORAL_TABLET | Freq: Two times a day (BID) | ORAL | 0 refills | Status: AC | PRN

## 2018-07-28 MED ORDER — IBUPROFEN 800 MG PO TABS: 800.0000 mg | ORAL_TABLET | Freq: Three times a day (TID) | ORAL | 0 refills | Status: AC | PRN
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
## 2018-07-28 NOTE — ED Notes (Signed)
Patient verbalizes understanding of discharge instructions. Opportunity for questioning and answers were provided. Armband removed by staff, pt discharged from ED.  

## 2018-07-29 ENCOUNTER — Emergency Department (HOSPITAL_COMMUNITY)
Admission: EM | Admit: 2018-07-29 | Discharge: 2018-07-29 | Disposition: A | Discharge status: Home / Self Care | Payer: No Typology Code available for payment source | Attending: Emergency Medicine | Admitting: Emergency Medicine

## 2018-07-29 ENCOUNTER — Encounter (HOSPITAL_COMMUNITY): Payer: Self-pay

## 2018-07-29 DIAGNOSIS — J45909 Unspecified asthma, uncomplicated: Secondary | ICD-10-CM | POA: Insufficient documentation

## 2018-07-29 DIAGNOSIS — Z91010 Allergy to peanuts: Secondary | ICD-10-CM | POA: Diagnosis not present

## 2018-07-29 DIAGNOSIS — Z79899 Other long term (current) drug therapy: Secondary | ICD-10-CM | POA: Diagnosis not present

## 2018-07-29 DIAGNOSIS — M542 Cervicalgia: Secondary | ICD-10-CM | POA: Diagnosis not present

## 2018-07-29 NOTE — ED Notes (Signed)
Declined W/C at D/C and was escorted to lobby by RN. 
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
## 2018-07-29 NOTE — ED Triage Notes (Signed)
PT reports taking pain meds once and neck still hurts. Pt has not tried heat or ice to neck.
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
## 2018-07-29 NOTE — Discharge Instructions (Signed)
Muscle cramps and spasms can take days to weeks to get better.  Muscle relaxers can help take the edge off the pain so that you can do things such as massage, gentle stretching, gentle range of motion to help with the muscle cramps and spasms.  If you have additional concerns, worsening symptoms please seek additional medical care.   Please take Ibuprofen (Advil, motrin) and Tylenol (acetaminophen) to relieve your pain.  You may take up to 600 MG (3 pills) of normal strength ibuprofen every 8 hours as needed.  In between doses of ibuprofen you make take tylenol, up to 1,000 mg (two extra strength pills).  Do not take more than 3,000 mg tylenol in a 24 hour period.  Please check all medication labels as many medications such as pain and cold medications may contain tylenol.  Do not drink alcohol while taking these medications.  Do not take other NSAID'S while taking ibuprofen (such as aleve or naproxen).  Please take ibuprofen with food to decrease stomach upset.
